# Patient Record
Sex: Female | Born: 1995 | Race: Black or African American | Hispanic: No | Marital: Single
Health system: Southern US, Community
[De-identification: ages and names within clinical notes are randomized; demographics above are authoritative.]

---
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
## 2015-04-27 ENCOUNTER — Other Ambulatory Visit: Payer: Self-pay | Admitting: Obstetrics and Gynecology

## 2015-04-27 ENCOUNTER — Other Ambulatory Visit (HOSPITAL_COMMUNITY)
Admission: RE | Admit: 2015-04-27 | Discharge: 2015-04-27 | Disposition: A | Discharge status: Home / Self Care | Payer: Federal, State, Local not specified - PPO | Source: Ambulatory Visit | Attending: Obstetrics and Gynecology | Admitting: Obstetrics and Gynecology

## 2015-04-27 DIAGNOSIS — N6002 Solitary cyst of left breast: Secondary | ICD-10-CM

## 2015-04-27 DIAGNOSIS — Z01411 Encounter for gynecological examination (general) (routine) with abnormal findings: Secondary | ICD-10-CM | POA: Insufficient documentation

## 2015-04-28 LAB — CYTOLOGY - PAP

## 2015-05-02 ENCOUNTER — Ambulatory Visit
Admission: RE | Admit: 2015-05-02 | Discharge: 2015-05-02 | Disposition: A | Discharge status: Home / Self Care | Payer: Federal, State, Local not specified - PPO | Source: Ambulatory Visit | Attending: Obstetrics and Gynecology | Admitting: Obstetrics and Gynecology

## 2015-05-02 ENCOUNTER — Other Ambulatory Visit: Payer: Self-pay | Admitting: Obstetrics and Gynecology

## 2015-05-02 ENCOUNTER — Other Ambulatory Visit: Payer: Self-pay

## 2015-05-02 DIAGNOSIS — N6320 Unspecified lump in the left breast, unspecified quadrant: Secondary | ICD-10-CM

## 2015-05-02 DIAGNOSIS — N6002 Solitary cyst of left breast: Secondary | ICD-10-CM

## 2015-05-05 ENCOUNTER — Other Ambulatory Visit: Payer: Self-pay | Admitting: Obstetrics and Gynecology

## 2015-05-05 DIAGNOSIS — N6320 Unspecified lump in the left breast, unspecified quadrant: Secondary | ICD-10-CM

## 2015-05-11 ENCOUNTER — Ambulatory Visit
Admission: RE | Admit: 2015-05-11 | Discharge: 2015-05-11 | Disposition: A | Discharge status: Home / Self Care | Payer: Federal, State, Local not specified - PPO | Source: Ambulatory Visit | Attending: Obstetrics and Gynecology | Admitting: Obstetrics and Gynecology

## 2015-05-11 DIAGNOSIS — N6320 Unspecified lump in the left breast, unspecified quadrant: Secondary | ICD-10-CM

## 2015-05-17 ENCOUNTER — Emergency Department (HOSPITAL_COMMUNITY)
Admission: EM | Admit: 2015-05-17 | Discharge: 2015-05-17 | Disposition: A | Discharge status: Home / Self Care | Payer: Federal, State, Local not specified - PPO | Attending: Emergency Medicine | Admitting: Emergency Medicine

## 2015-05-17 ENCOUNTER — Encounter (HOSPITAL_COMMUNITY): Payer: Self-pay | Admitting: Emergency Medicine

## 2015-05-17 DIAGNOSIS — R197 Diarrhea, unspecified: Secondary | ICD-10-CM | POA: Diagnosis not present

## 2015-05-17 DIAGNOSIS — R112 Nausea with vomiting, unspecified: Secondary | ICD-10-CM

## 2015-05-17 DIAGNOSIS — N390 Urinary tract infection, site not specified: Secondary | ICD-10-CM | POA: Diagnosis not present

## 2015-05-17 DIAGNOSIS — Z793 Long term (current) use of hormonal contraceptives: Secondary | ICD-10-CM | POA: Insufficient documentation

## 2015-05-17 DIAGNOSIS — Z3202 Encounter for pregnancy test, result negative: Secondary | ICD-10-CM | POA: Diagnosis not present

## 2015-05-17 DIAGNOSIS — R1013 Epigastric pain: Secondary | ICD-10-CM | POA: Diagnosis present

## 2015-05-17 LAB — URINE MICROSCOPIC-ADD ON

## 2015-05-17 LAB — ETHANOL: Alcohol, Ethyl (B): <5 mg/dL (ref <5)

## 2015-05-17 LAB — COMPREHENSIVE METABOLIC PANEL
ALT: 25 U/L (ref 14–54)
ANION GAP: 4 — AB (ref 5–15)
AST: 29 U/L (ref 15–41)
Albumin: 4.8 g/dL (ref 3.5–5.0)
Alkaline Phosphatase: 73 U/L (ref 38–126)
BUN: 9 mg/dL (ref 6–20)
CALCIUM: 9.4 mg/dL (ref 8.9–10.3)
CHLORIDE: 107 mmol/L (ref 101–111)
CO2: 25 mmol/L (ref 22–32)
CREATININE: 0.82 mg/dL (ref 0.44–1.00)
Glucose, Bld: 118 mg/dL — ABNORMAL HIGH (ref 65–99)
Potassium: 3.9 mmol/L (ref 3.5–5.1)
SODIUM: 136 mmol/L (ref 135–145)
Total Bilirubin: 0.5 mg/dL (ref 0.3–1.2)
Total Protein: 8.1 g/dL (ref 6.5–8.1)

## 2015-05-17 LAB — URINALYSIS, ROUTINE W REFLEX MICROSCOPIC
Bilirubin Urine: NEGATIVE
Glucose, UA: NEGATIVE mg/dL
Hgb urine dipstick: NEGATIVE
KETONES UR: NEGATIVE mg/dL
NITRITE: NEGATIVE
PROTEIN: NEGATIVE mg/dL
Specific Gravity, Urine: 1.02 (ref 1.005–1.030)
Urobilinogen, UA: 0.2 mg/dL (ref 0.0–1.0)
pH: 5 (ref 5.0–8.0)

## 2015-05-17 LAB — CBC
HCT: 44.6 % (ref 36.0–46.0)
HEMOGLOBIN: 14.8 g/dL (ref 12.0–15.0)
MCH: 28.7 pg (ref 26.0–34.0)
MCHC: 33.2 g/dL (ref 30.0–36.0)
MCV: 86.6 fL (ref 78.0–100.0)
PLATELETS: 195 10*3/uL (ref 150–400)
RBC: 5.15 MIL/uL — AB (ref 3.87–5.11)
RDW: 13 % (ref 11.5–15.5)
WBC: 5.7 10*3/uL (ref 4.0–10.5)

## 2015-05-17 LAB — POC URINE PREG, ED: Preg Test, Ur: NEGATIVE

## 2015-05-17 LAB — LIPASE, BLOOD: LIPASE: 54 U/L — AB (ref 22–51)

## 2015-05-17 MED ORDER — PROMETHAZINE HCL 25 MG PO TABS: 12.5000 mg | ORAL_TABLET | Freq: Four times a day (QID) | ORAL | Status: AC | PRN

## 2015-05-17 MED ORDER — ONDANSETRON HCL 4 MG/2ML IJ SOLN
4.0000 mg | Freq: Once | INTRAMUSCULAR | Status: AC
  Administered 2015-05-17: 4 mg via INTRAVENOUS
  Filled 2015-05-17: qty 2

## 2015-05-17 MED ORDER — PROCHLORPERAZINE EDISYLATE 5 MG/ML IJ SOLN
5.0000 mg | Freq: Once | INTRAMUSCULAR | Status: AC
  Administered 2015-05-17: 5 mg via INTRAVENOUS
  Filled 2015-05-17: qty 2

## 2015-05-17 MED ORDER — DICYCLOMINE HCL 20 MG PO TABS: 20.0000 mg | ORAL_TABLET | Freq: Two times a day (BID) | ORAL | Status: AC

## 2015-05-17 MED ORDER — DIPHENHYDRAMINE HCL 50 MG/ML IJ SOLN
25.0000 mg | Freq: Once | INTRAMUSCULAR | Status: AC
  Administered 2015-05-17: 25 mg via INTRAVENOUS
  Filled 2015-05-17: qty 1

## 2015-05-17 MED ORDER — KETOROLAC TROMETHAMINE 30 MG/ML IJ SOLN
30.0000 mg | Freq: Once | INTRAMUSCULAR | Status: AC
  Administered 2015-05-17: 30 mg via INTRAVENOUS
  Filled 2015-05-17: qty 1

## 2015-05-17 MED ORDER — ONDANSETRON HCL 4 MG/2ML IJ SOLN
4.0000 mg | Freq: Once | INTRAMUSCULAR | Status: AC | PRN
  Administered 2015-05-17: 4 mg via INTRAVENOUS
  Filled 2015-05-17: qty 2

## 2015-05-17 MED ORDER — NAPROXEN 375 MG PO TABS: ORAL_TABLET | ORAL | Status: AC

## 2015-05-17 MED ORDER — DEXTROSE 5 % IV SOLN
1.0000 g | Freq: Once | INTRAVENOUS | Status: AC
  Administered 2015-05-17: 1 g via INTRAVENOUS
  Filled 2015-05-17: qty 10

## 2015-05-17 MED ORDER — SODIUM CHLORIDE 0.9 % IV BOLUS (SEPSIS)
1000.0000 mL | Freq: Once | INTRAVENOUS | Status: AC
  Administered 2015-05-17: 1000 mL via INTRAVENOUS

## 2015-05-17 MED ORDER — CEPHALEXIN 500 MG PO CAPS: 500.0000 mg | ORAL_CAPSULE | Freq: Two times a day (BID) | ORAL | Status: AC

## 2015-05-17 NOTE — ED Notes (Addendum)
Per EMS pt coming from home. 1 hr prior began with sudden onset of severe sharp generalized abd pain, with N/V/D. Pt is sexually active, does not use birth control, unknown if pregnant. Pt states this has never happened before.   Pt complaining of generalized lower abdominal pain that's increasing. Denies any dysuria/hematuria, tender to RLQ and midline of abdomin with some pain radiating into back.

## 2015-05-17 NOTE — Progress Notes (Addendum)
[facility] ED CM noted pt with coverage but no pcp listed Spoke with pt who confirms no pcp  Pt reports she is from [facility], she is a Consulting civil engineer at [employer]  and her mother has her insurance card  [employer] ED CM spoke with pt on how to obtain an in network pcp with insurance coverage via the customer service number or web site Or how to use the campus health center  Cm reviewed ED level of care for crisis/emergent services and community pcp level of care to manage continuous or chronic medical concerns.  The pt voiced understanding CM encouraged pt and discussed pt's responsibility to verify with pt's insurance carrier that any recommended medical provider offered by any emergency room or a hospital provider is within the carrier's network. The pt voiced understanding

## 2015-05-17 NOTE — ED Notes (Signed)
GIVEN CRACKERS AND GINGER- ALE

## 2015-05-17 NOTE — ED Provider Notes (Signed)
CSN: [id number]     Arrival date & time 05/17/15  1215 History   First MD Initiated Contact with Patient 05/17/15 1249     Chief Complaint  Patient presents with  . Abdominal Pain  . Emesis     (Consider location/radiation/quality/duration/timing/severity/associated sxs/prior Treatment) HPI   Heidi Fitzgerald is a(n) 19 y.o. female who presents to the ED with cc of sudden onset abdominal pain, nausea, vomiting, and diarrhea. 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 Patient states that she has epigastric and suprabupic abdominal pain. She had several episodes of vomiting NBNB vomitous. She had several loose stools. She denies contacts with similar sxs, ingestion of suspicious foods or water, history of similar sxs, recent foreign travel . Denies urinary or vaginal sxs. No hx of uti.     History reviewed. No pertinent past medical history. History reviewed. No pertinent past surgical history. History reviewed. No pertinent family history. Social History  Substance Use Topics  . Smoking status: None  . Smokeless tobacco: None  . Alcohol Use: None   OB History    No data available     Review of Systems  Ten systems reviewed and are negative for acute change, except as noted in the HPI.    Allergies  Review of patient's allergies indicates no known allergies.  Home Medications   Prior to Admission medications   Medication Sig Start Date End Date Taking? Authorizing Provider  norethindrone-ethinyl estradiol (MICROGESTIN,JUNEL,LOESTRIN) 1-20 MG-MCG tablet Take 1 tablet by mouth daily. 10/24/14  Yes Historical Provider, MD  PRESCRIPTION MEDICATION Take 1 tablet by mouth every 4 (four) hours as needed (pain).   Yes Historical Provider, MD  cephALEXin (KEFLEX) 500 MG capsule Take 1 capsule (500 mg total) by mouth 2 (two) times daily. 05/17/15   Arthor Captain, PA-C  dicyclomine (BENTYL) 20 MG tablet Take 1 tablet (20 mg total) by mouth 2 (two) times daily. 05/17/15    Arthor Captain, PA-C  naproxen (NAPROSYN) 375 MG tablet Take one tablet every 12 hours with food as needed for pain. 05/17/15   Arthor Captain, PA-C  promethazine (PHENERGAN) 25 MG tablet Take 0.5-1 tablets (12.5-25 mg total) by mouth every 6 (six) hours as needed for nausea or vomiting. 05/17/15   Luke Rigsbee, PA-C   BP 104/72 mmHg  Pulse 70  Temp(Src) 97.4 F (36.3 C) (Oral)  Resp 16  SpO2 100%  LMP 04/17/2015 Physical Exam  Constitutional: She is oriented to person, place, and time. She appears well-developed and well-nourished. No distress.  HENT:  Head: Normocephalic and atraumatic.  Eyes: Conjunctivae are normal. No scleral icterus.  Neck: Normal range of motion.  Cardiovascular: Normal rate, regular rhythm and normal heart sounds.  Exam reveals no gallop and no friction rub.   No murmur heard. Pulmonary/Chest: Effort normal and breath sounds normal. No respiratory distress.  Abdominal: Soft. Bowel sounds are normal. She exhibits no distension and no mass. There is tenderness. There is no guarding.    Neurological: She is alert and oriented to person, place, and time.  Skin: Skin is warm and dry. She is not diaphoretic.  Nursing note and vitals reviewed.   ED Course  Procedures (including critical care time) Labs Review Labs Reviewed  LIPASE, BLOOD - Abnormal; Notable for the following:    Lipase 54 (*)    All other components within normal limits  COMPREHENSIVE METABOLIC PANEL - Abnormal; Notable for the following:    Glucose, Bld 118 (*)  Anion gap 4 (*)    All other components within normal limits  CBC - Abnormal; Notable for the following:    RBC 5.15 (*)    All other components within normal limits  URINALYSIS, ROUTINE W REFLEX MICROSCOPIC (NOT AT [facility]) - Abnormal; Notable for the following:    APPearance CLOUDY (*)    Leukocytes, UA MODERATE (*)    All other components within normal limits  URINE MICROSCOPIC-ADD ON - Abnormal; Notable for the following:      Squamous Epithelial / LPF MANY (*)    Bacteria, UA MANY (*)    Casts HYALINE CASTS (*)    All other components within normal limits  URINE CULTURE  ETHANOL  POC URINE PREG, ED    Imaging Review No results found. I have personally reviewed and evaluated these images and lab results as part of my medical decision-making.   EKG Interpretation None      MDM   Final diagnoses:  Nausea vomiting and diarrhea  UTI (lower urinary tract infection)    3:18 PM BP 104/72 mmHg  Pulse 70  Temp(Src) 97.4 F (36.3 C) (Oral)  Resp 16  SpO2 100%  LMP 04/17/2015 Patient with questionable uti, N/v/d, sudden onset. Labs are otherwise reassuring Patient is nontoxic, nonseptic appearing, in no apparent distress.  Patient's pain and other symptoms adequately managed in emergency department.  Fluid bolus given.  Labs, imaging and vitals reviewed.  Patient does not meet the SIRS or Sepsis criteria.  On repeat exam patient does not have a surgical abdomin and there are no peritoneal signs.  No indication of appendicitis, bowel obstruction, bowel perforation, cholecystitis, diverticulitis.  Patient discharged home with symptomatic treatment and given strict instructions for follow-up with their primary care physician.  I have also discussed reasons to return immediately to the ER.  Patient expresses understanding and agrees with plan.       Arthor Captain, PA-C 05/17/15 1523  Pricilla Loveless, MD 05/18/15 [phone]

## 2015-05-17 NOTE — Discharge Instructions (Signed)
Nausea and Vomiting °Nausea is a sick feeling that often comes before throwing up (vomiting). Vomiting is a reflex where stomach contents come out of your mouth. Vomiting can cause severe loss of body fluids (dehydration). Children and elderly adults can become dehydrated quickly, especially if they also have diarrhea. Nausea and vomiting are symptoms of a condition or disease. It is important to find the cause of your symptoms. °CAUSES  °· Direct irritation of the stomach lining. This irritation can result from increased acid production (gastroesophageal reflux disease), infection, food poisoning, taking certain medicines (such as nonsteroidal anti-inflammatory drugs), alcohol use, or tobacco use. °· Signals from the brain. These signals could be caused by a headache, heat exposure, an inner ear disturbance, increased pressure in the brain from injury, infection, a tumor, or a concussion, pain, emotional stimulus, or metabolic problems. °· An obstruction in the gastrointestinal tract (bowel obstruction). °· Illnesses such as diabetes, hepatitis, gallbladder problems, appendicitis, kidney problems, cancer, sepsis, atypical symptoms of a heart attack, or eating disorders. °· Medical treatments such as chemotherapy and radiation. °· Receiving medicine that makes you sleep (general anesthetic) during surgery. °DIAGNOSIS °Your caregiver may ask for tests to be done if the problems do not improve after a few days. Tests may also be done if symptoms are severe or if the reason for the nausea and vomiting is not clear. Tests may include: °· Urine tests. °· Blood tests. °· Stool tests. °· Cultures (to look for evidence of infection). °· X-rays or other imaging studies. °Test results can help your caregiver make decisions about treatment or the need for additional tests. °TREATMENT °You need to stay well hydrated. Drink frequently but in small amounts. You may wish to drink water, sports drinks, clear broth, or eat frozen  ice pops or gelatin dessert to help stay hydrated. When you eat, eating slowly may help prevent nausea. There are also some antinausea medicines that may help prevent nausea. °HOME CARE INSTRUCTIONS  °· Take all medicine as directed by your caregiver. °· If you do not have an appetite, do not force yourself to eat. However, you must continue to drink fluids. °· If you have an appetite, eat a normal diet unless your caregiver tells you differently. °· Eat a variety of complex carbohydrates (rice, wheat, potatoes, bread), lean meats, yogurt, fruits, and vegetables. °· Avoid high-fat foods because they are more difficult to digest. °· Drink enough water and fluids to keep your urine clear or pale yellow. °· If you are dehydrated, ask your caregiver for specific rehydration instructions. Signs of dehydration may include: °· Severe thirst. °· Dry lips and mouth. °· Dizziness. °· Dark urine. °· Decreasing urine frequency and amount. °· Confusion. °· Rapid breathing or pulse. °SEEK IMMEDIATE MEDICAL CARE IF:  °· You have blood or brown flecks (like coffee grounds) in your vomit. °· You have black or bloody stools. °· You have a severe headache or stiff neck. °· You are confused. °· You have severe abdominal pain. °· You have chest pain or trouble breathing. °· You do not urinate at least once every 8 hours. °· You develop cold or clammy skin. °· You continue to vomit for longer than 24 to 48 hours. °· You have a fever. °MAKE SURE YOU:  °· Understand these instructions. °· Will watch your condition. °· Will get help right away if you are not doing well or get worse. °  °This information is not intended to replace advice given to you by your health care provider. Make sure   you discuss any questions you have with your health care provider.   Document Released: 07/29/2005 Document Revised: 10/21/2011 Document Reviewed: 12/26/2010 Elsevier Interactive Patient Education 2016 Elsevier Inc.  Urinary Tract Infection Urinary  tract infections (UTIs) can develop anywhere along your urinary tract. Your urinary tract is your body's drainage system for removing wastes and extra water. Your urinary tract includes two kidneys, two ureters, a bladder, and a urethra. Your kidneys are a pair of bean-shaped organs. Each kidney is about the size of your fist. They are located below your ribs, one on each side of your spine. CAUSES Infections are caused by microbes, which are microscopic organisms, including fungi, viruses, and bacteria. These organisms are so small that they can only be seen through a microscope. Bacteria are the microbes that most commonly cause UTIs. SYMPTOMS  Symptoms of UTIs may vary by age and gender of the patient and by the location of the infection. Symptoms in young women typically include a frequent and intense urge to urinate and a painful, burning feeling in the bladder or urethra during urination. Older women and men are more likely to be tired, shaky, and weak and have muscle aches and abdominal pain. A fever may mean the infection is in your kidneys. Other symptoms of a kidney infection include pain in your back or sides below the ribs, nausea, and vomiting. DIAGNOSIS To diagnose a UTI, your caregiver will ask you about your symptoms. Your caregiver will also ask you to provide a urine sample. The urine sample will be tested for bacteria and white blood cells. White blood cells are made by your body to help fight infection. TREATMENT  Typically, UTIs can be treated with medication. Because most UTIs are caused by a bacterial infection, they usually can be treated with the use of antibiotics. The choice of antibiotic and length of treatment depend on your symptoms and the type of bacteria causing your infection. HOME CARE INSTRUCTIONS  If you were prescribed antibiotics, take them exactly as your caregiver instructs you. Finish the medication even if you feel better after you have only taken some of the  medication.  Drink enough water and fluids to keep your urine clear or pale yellow.  Avoid caffeine, tea, and carbonated beverages. They tend to irritate your bladder.  Empty your bladder often. Avoid holding urine for long periods of time.  Empty your bladder before and after sexual intercourse.  After a bowel movement, women should cleanse from front to back. Use each tissue only once. SEEK MEDICAL CARE IF:   You have back pain.  You develop a fever.  Your symptoms do not begin to resolve within 3 days. SEEK IMMEDIATE MEDICAL CARE IF:   You have severe back pain or lower abdominal pain.  You develop chills.  You have nausea or vomiting.  You have continued burning or discomfort with urination. MAKE SURE YOU:   Understand these instructions.  Will watch your condition.  Will get help right away if you are not doing well or get worse.   This information is not intended to replace advice given to you by your health care provider. Make sure you discuss any questions you have with your health care provider.   Document Released: 05/08/2005 Document Revised: 04/19/2015 Document Reviewed: 09/06/2011 Elsevier Interactive Patient Education 2016 [employer]. Viral Gastroenteritis Viral gastroenteritis is also known as stomach flu. This condition affects the stomach and intestinal tract. It can cause sudden diarrhea and vomiting. The illness typically lasts 3  to 8 days. Most people develop an immune response that eventually gets rid of the virus. While this natural response develops, the virus can make you quite ill. CAUSES  Many different viruses can cause gastroenteritis, such as rotavirus or noroviruses. You can catch one of these viruses by consuming contaminated food or water. You may also catch a virus by sharing utensils or other personal items with an infected person or by touching a contaminated surface. SYMPTOMS  The most common symptoms are diarrhea and vomiting. These  problems can cause a severe loss of body fluids (dehydration) and a body salt (electrolyte) imbalance. Other symptoms may include:  Fever.  Headache.  Fatigue.  Abdominal pain. DIAGNOSIS  Your caregiver can usually diagnose viral gastroenteritis based on your symptoms and a physical exam. A stool sample may also be taken to test for the presence of viruses or other infections. TREATMENT  This illness typically goes away on its own. Treatments are aimed at rehydration. The most serious cases of viral gastroenteritis involve vomiting so severely that you are not able to keep fluids down. In these cases, fluids must be given through an intravenous line (IV). HOME CARE INSTRUCTIONS   Drink enough fluids to keep your urine clear or pale yellow. Drink small amounts of fluids frequently and increase the amounts as tolerated.  Ask your caregiver for specific rehydration instructions.  Avoid:  Foods high in sugar.  Alcohol.  Carbonated drinks.  Tobacco.  Juice.  Caffeine drinks.  Extremely hot or cold fluids.  Fatty, greasy foods.  Too much intake of anything at one time.  Dairy products until 24 to 48 hours after diarrhea stops.  You may consume probiotics. Probiotics are active cultures of beneficial bacteria. They may lessen the amount and number of diarrheal stools in adults. Probiotics can be found in yogurt with active cultures and in supplements.  Wash your hands well to avoid spreading the virus.  Only take over-the-counter or prescription medicines for pain, discomfort, or fever as directed by your caregiver. Do not give aspirin to children. Antidiarrheal medicines are not recommended.  Ask your caregiver if you should continue to take your regular prescribed and over-the-counter medicines.  Keep all follow-up appointments as directed by your caregiver. SEEK IMMEDIATE MEDICAL CARE IF:   You are unable to keep fluids down.  You do not urinate at least once every 6  to 8 hours.  You develop shortness of breath.  You notice blood in your stool or vomit. This may look like coffee grounds.  You have abdominal pain that increases or is concentrated in one small area (localized).  You have persistent vomiting or diarrhea.  You have a fever.  The patient is a child younger than 3 months, and he or she has a fever.  The patient is a child older than 3 months, and he or she has a fever and persistent symptoms.  The patient is a child older than 3 months, and he or she has a fever and symptoms suddenly get worse.  The patient is a baby, and he or she has no tears when crying. MAKE SURE YOU:   Understand these instructions.  Will watch your condition.  Will get help right away if you are not doing well or get worse.   This information is not intended to replace advice given to you by your health care provider. Make sure you discuss any questions you have with your health care provider.   Document Released: 07/29/2005 Document Revised:  10/21/2011 Document Reviewed: 05/15/2011 Elsevier Interactive Patient Education [employer]

## 2015-05-17 NOTE — ED Notes (Signed)
Bed: WA17 Expected date:  Expected time:  Means of arrival:  Comments: Ems abd pain 

## 2015-05-19 LAB — URINE CULTURE: SPECIAL REQUESTS: NORMAL

## 2015-06-12 ENCOUNTER — Ambulatory Visit: Payer: Self-pay | Admitting: Surgery

## 2015-07-25 ENCOUNTER — Other Ambulatory Visit: Payer: Self-pay | Admitting: Surgery

## 2015-10-06 IMAGING — US US BREAST LTD UNI LEFT INC AXILLA
1 series · 9 of 9 positions shown · non-contrast
Comparison: No prior exams available for comparison.

CLINICAL DATA: 19-year-old female presenting with a palpable
abnormality discovered 1 year ago.

EXAM:
ULTRASOUND OF THE LEFT BREAST

[Series 1: us breast ltd uni left inc axilla · 0.06mm/px · 9 of 9 slices shown]
[im 1/9]
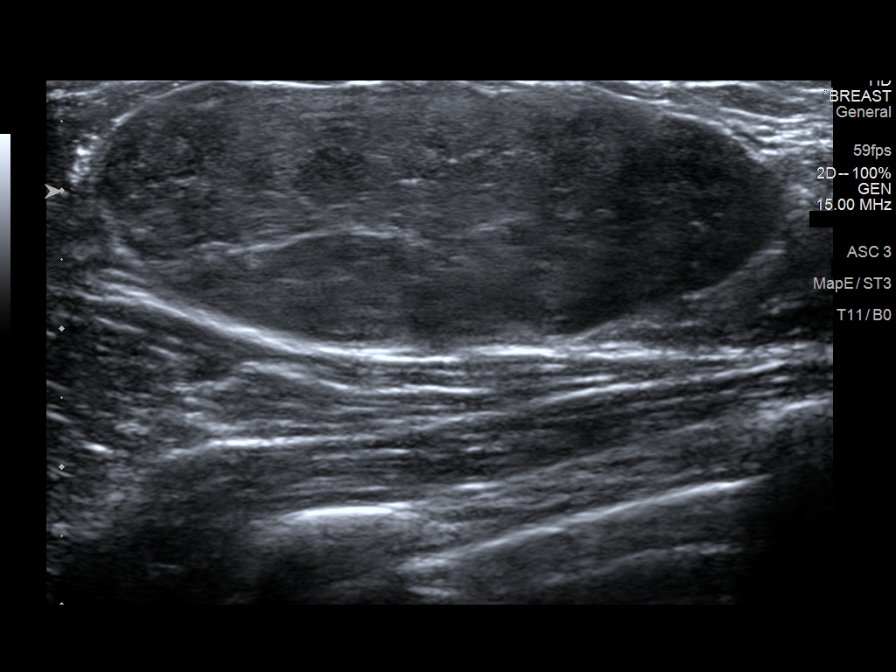
[im 2/9]
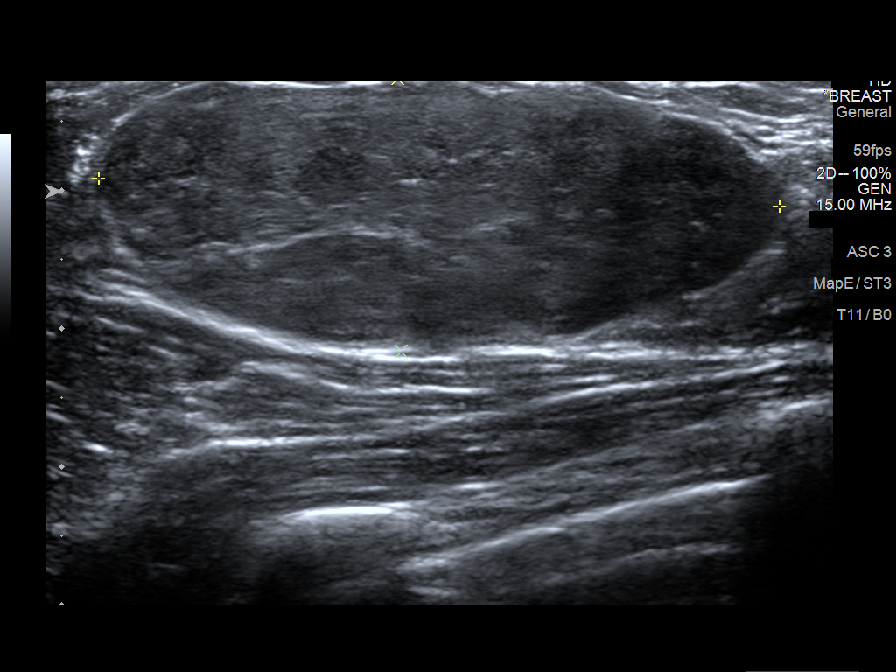
[im 3/9]
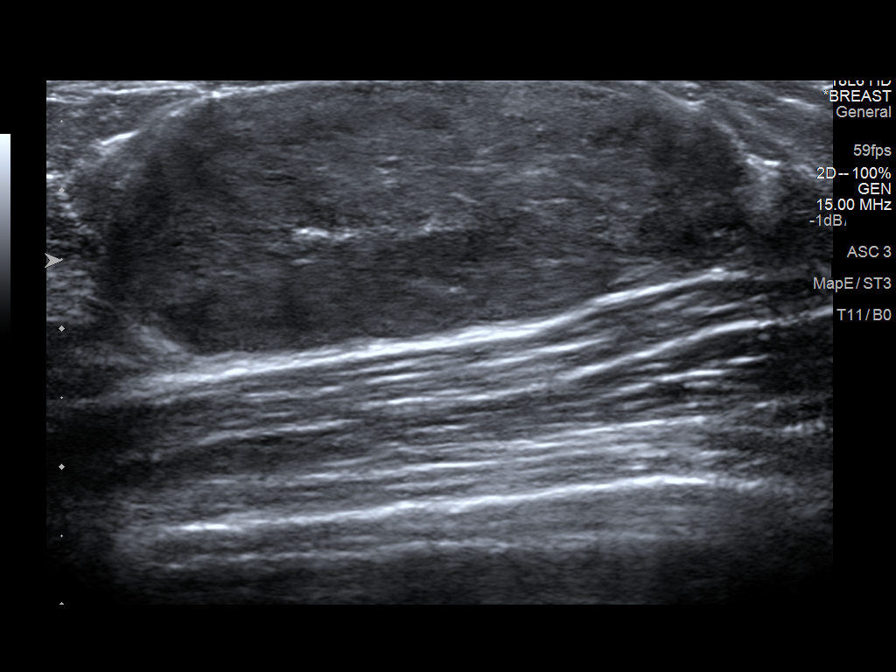
[im 4/9]
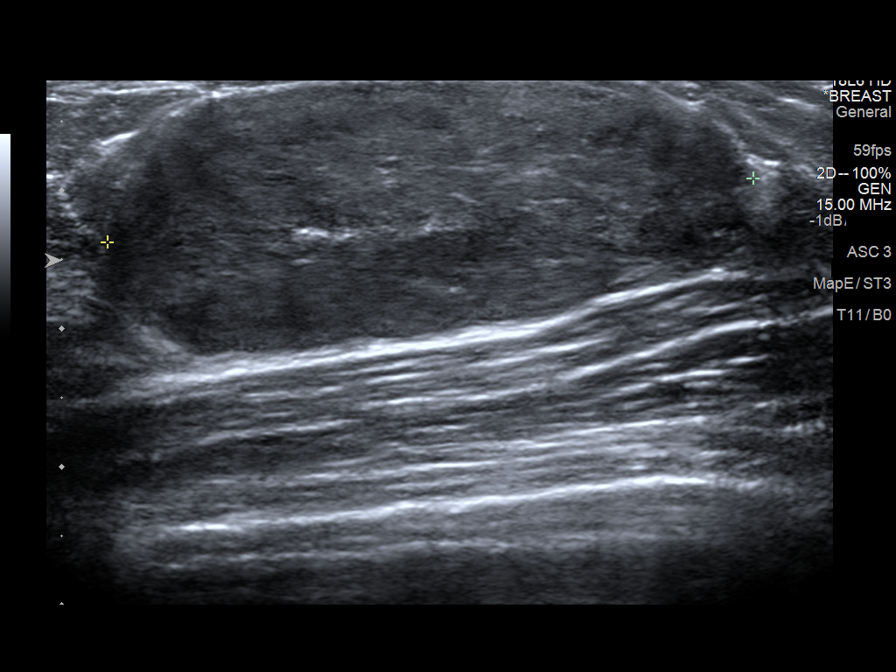
[im 5/9]
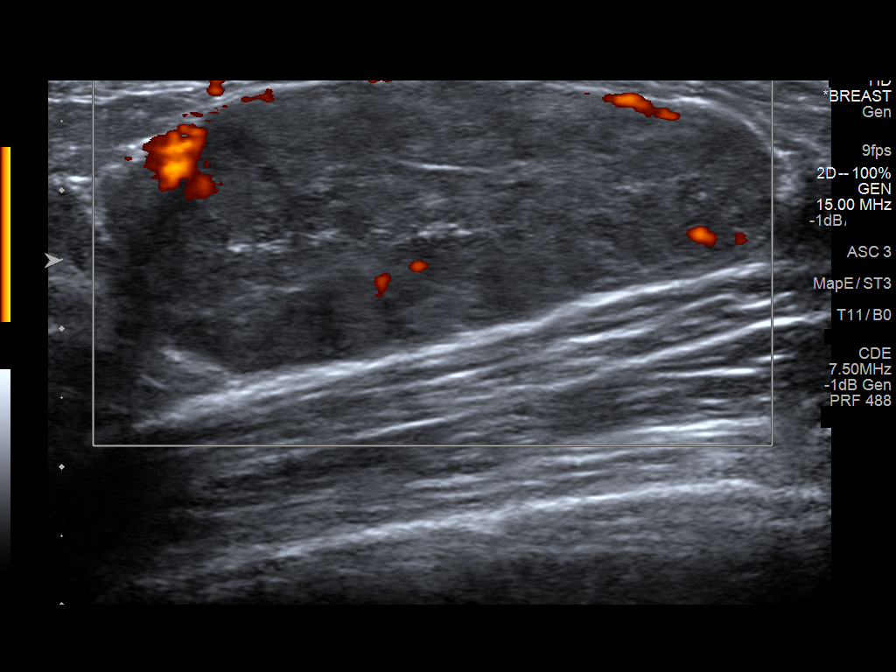
[im 6/9]
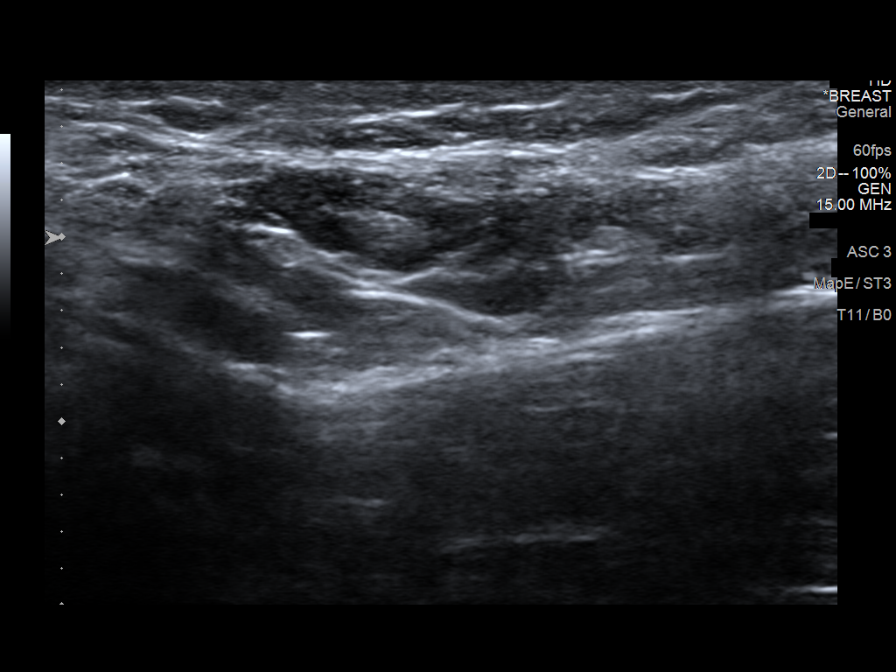
[im 7/9]
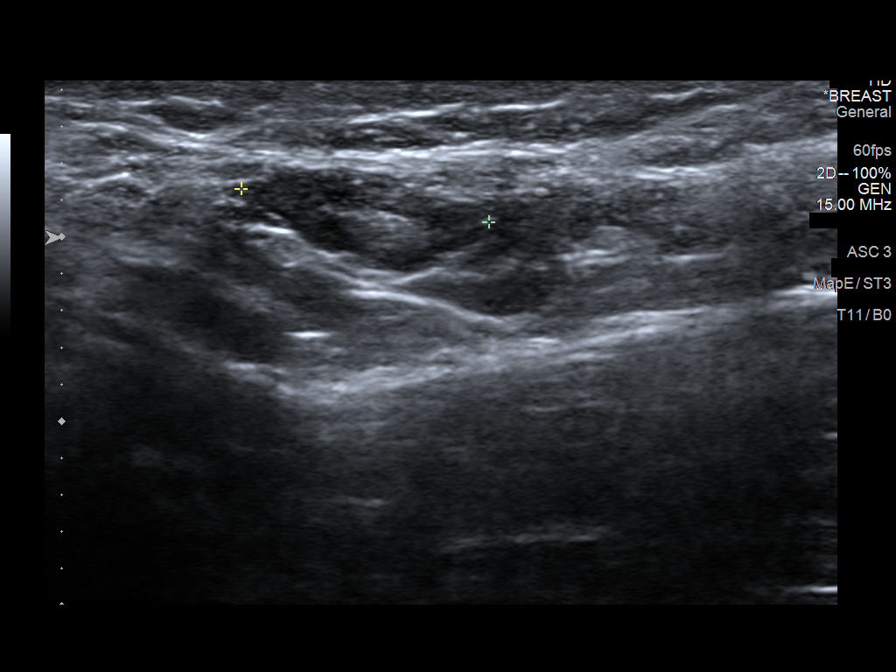
[im 8/9]
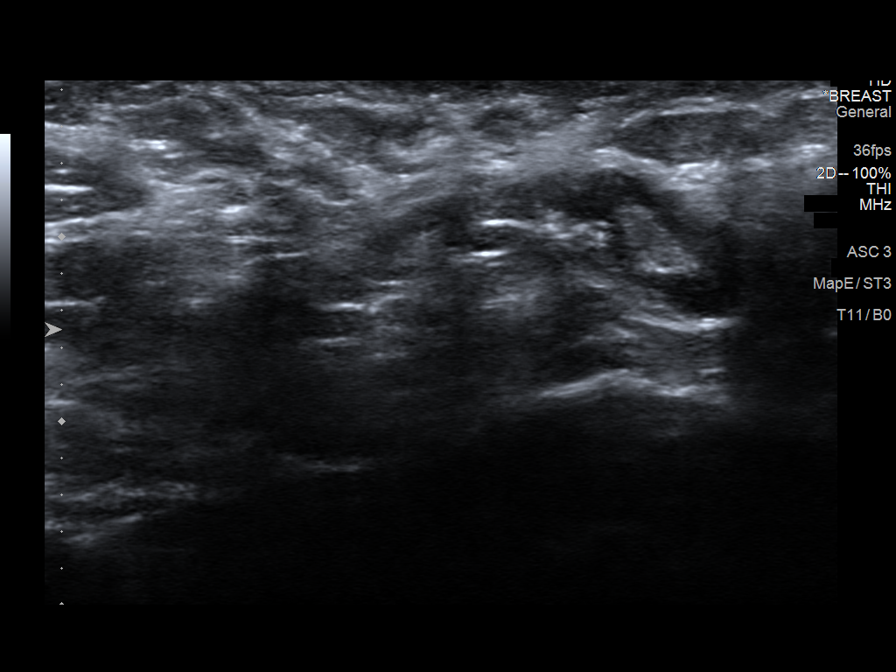
[im 9/9]
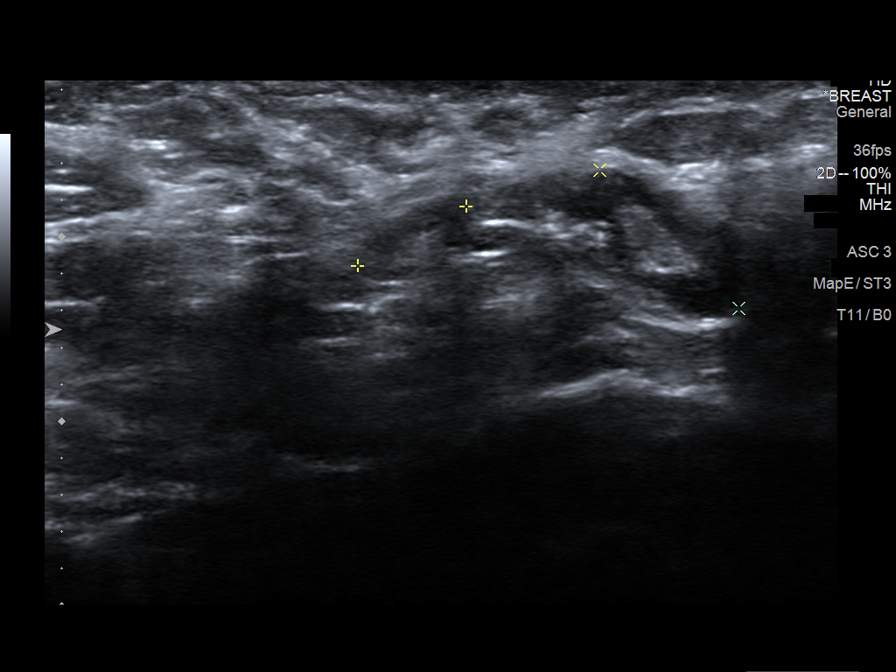

[9 of 9 positions shown; findings below may reference images not displayed]

FINDINGS: On physical exam, a large soft smooth mobile mass is palpated in the
upper-outer quadrant of the left breast.

Targeted ultrasound is performed, showing a circumscribed hypoechoic
mass at 1 o'clock, 6 cm from the nipple measuring 4.9 x 2.0 x
cm.
IMPRESSION: A 4.9 cm mass corresponds with the palpable area of concern in the
upper-outer left breast. This is favored to represent a
fibroadenoma, but given the size and the fact that it is a palpable
isolated finding, biopsy is recommended.

RECOMMENDATION:
Ultrasound-guided biopsy is recommended for the large palpable left
breast mass at 1 o'clock. This has been scheduled for 05/11/2015 at
2 o'clock p.m.. The patient desires surgical excision regardless of
pathologic results, and surgical referral can be made.

I have discussed the findings and recommendations with the patient.
Results were also provided in writing at the conclusion of the
visit. If applicable, a reminder letter will be sent to the patient
regarding the next appointment.

BI-RADS CATEGORY  4: Suspicious abnormality - biopsy should be
considered.

## 2016-07-15 IMAGING — US US BREAST BX W LOC DEV 1ST LESION IMG BX SPEC US GUIDE*L*
1 series · 10 of 10 positions shown · non-contrast
Comparison: Previous exam(s).

ADDENDUM:
Pathology revealed a fibroadenoma in the left breast. This was found
to be concordant by Dr. Auntyjatty Delowr. Pathology was discussed with the
patient by telephone. She reported bleeding on the evening of the
biopsy for which she had to hold pressure. She has not had an
additional bleeding and states that it is tender at the site. Post
biopsy instructions and care were reviewed and her questions were
answered. The patient requests a surgical consultation for possible
excision. An appointment has been made with Dr. Yonas Abrham Portughes at
[REDACTED] on May 23, 2015.

Pathology results reported by Gabone Mateus RN, BSN on May 12, 2015.
CLINICAL DATA: 19-year-old female for ultrasound-guided biopsy of a
palpable left breast mass at 1 o'clock, 6 cm from the nipple
EXAM:
ULTRASOUND GUIDED LEFT BREAST CORE NEEDLE BIOPSY

[Series 1: advbreast · 10 of 10 slices shown]
[im 1/10]
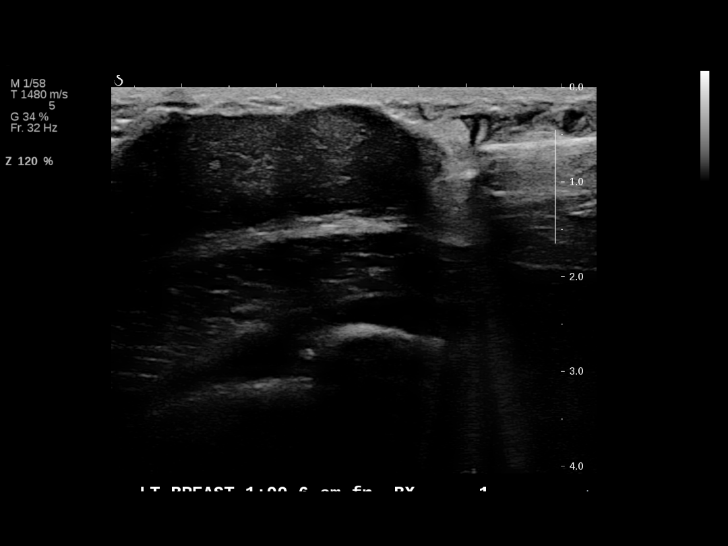
[im 2/10]
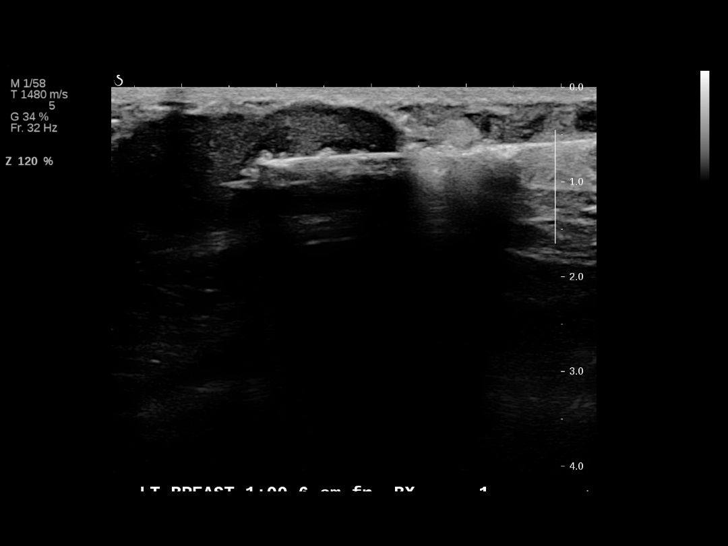
[im 3/10]
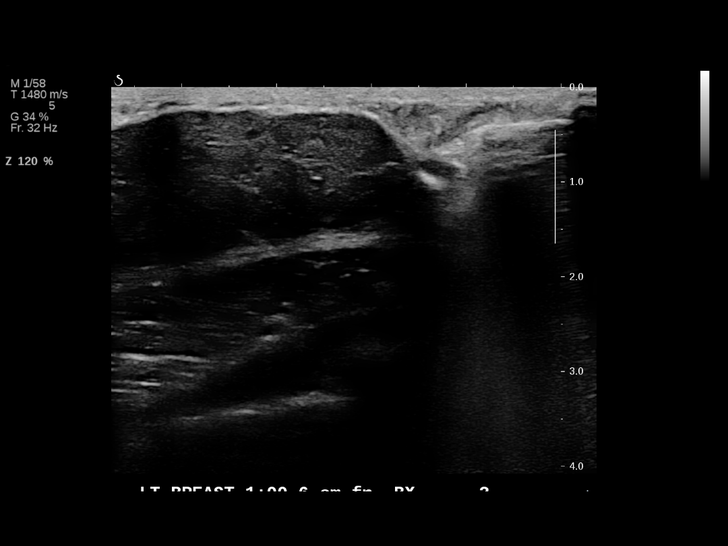
[im 4/10]
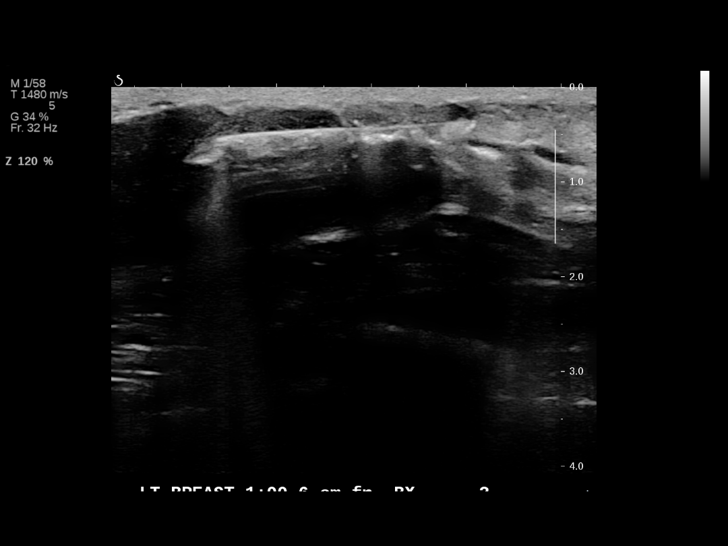
[im 5/10]
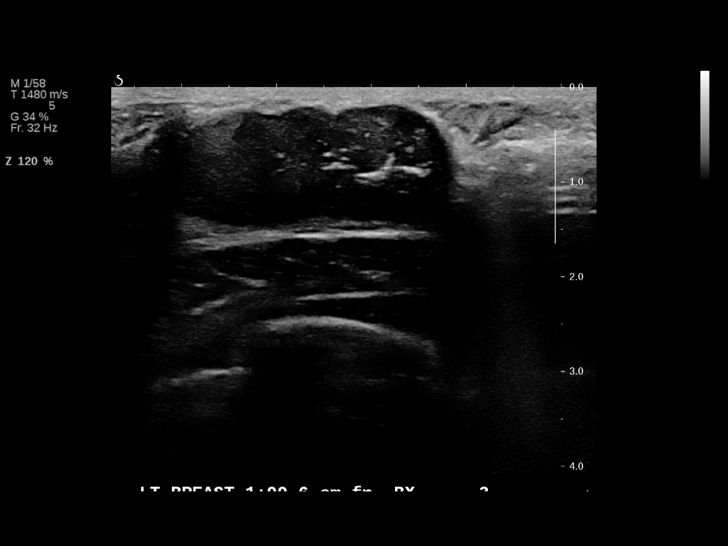
[im 6/10]
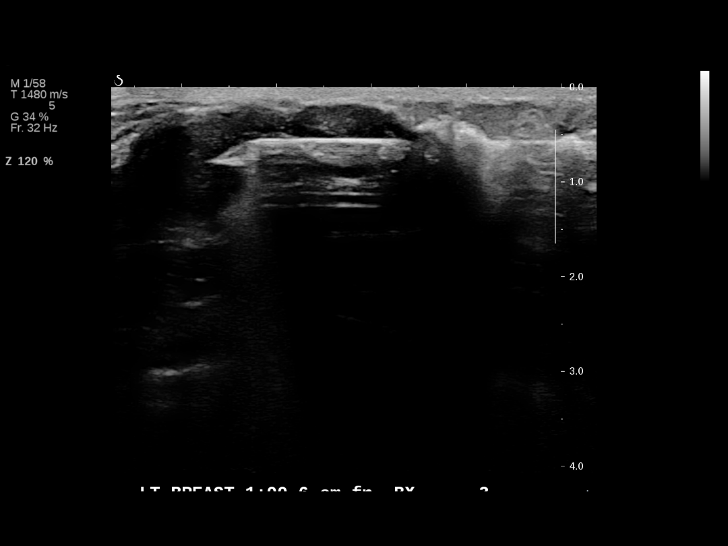
[im 7/10]
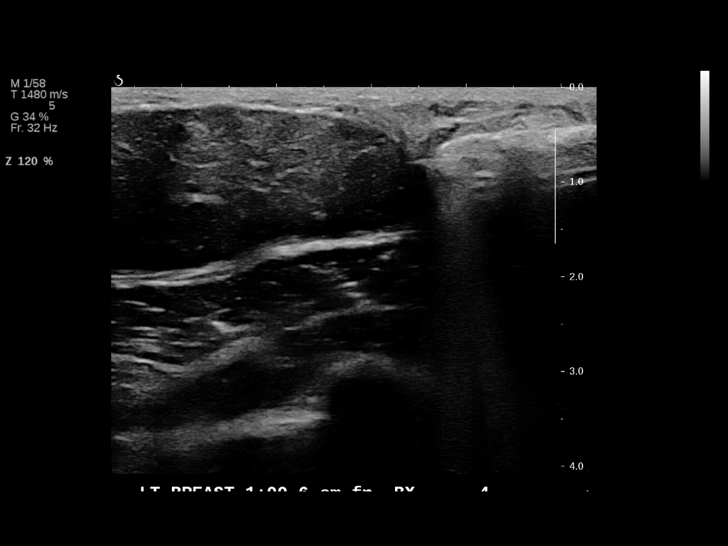
[im 8/10]
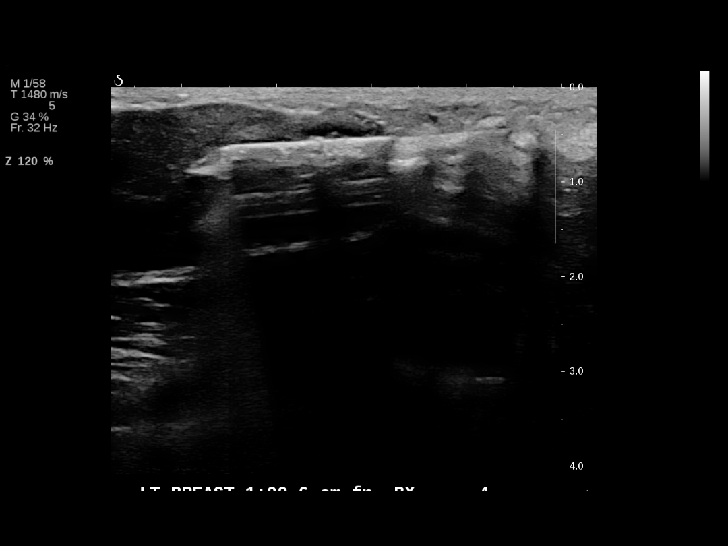
[im 9/10]
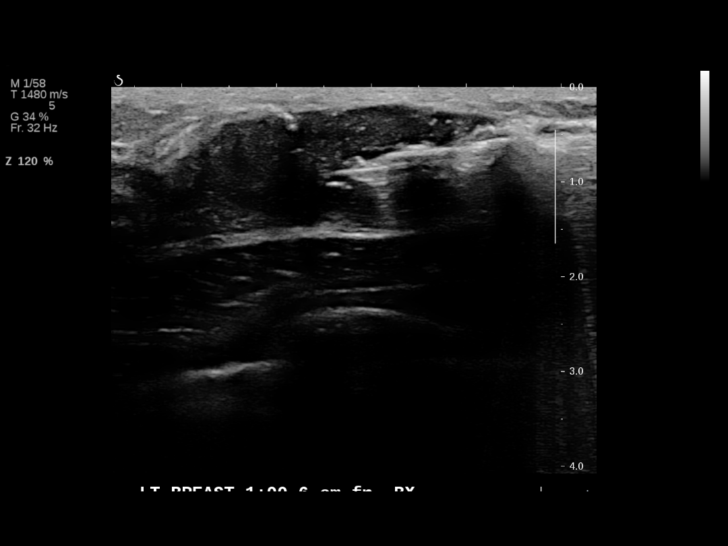
[im 10/10]
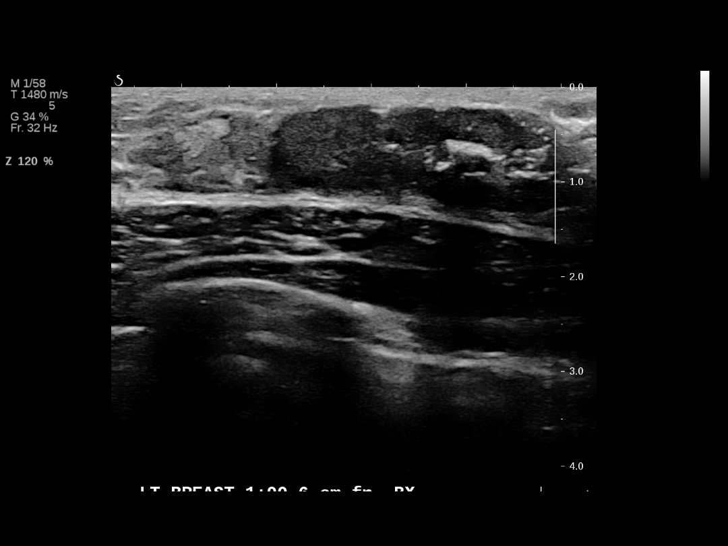

[10 of 10 positions shown; findings below may reference images not displayed]



Using sterile technique and 2% Lidocaine as local anesthetic, under
direct ultrasound visualization, a 12 gauge Folkes device was
used to perform biopsy of a left breast mass at 1 o'clock, 6 cm from
the nipple using a lateral to medial approach. At the conclusion of
the procedure a ribbon shaped tissue marker clip was deployed into
the biopsy cavity.
IMPRESSION: Ultrasound guided biopsy of a left breast mass at 1 o'clock, 6 cm
from nipple. No apparent complications.
